# Patient Record
Sex: Female | Born: 1970 | Race: White | Hispanic: No | Marital: Married | Smoking: Never smoker
Health system: Southern US, Community
[De-identification: ages and names within clinical notes are randomized; demographics above are authoritative.]

## PROBLEM LIST (undated history)

## (undated) HISTORY — PX: TUBAL LIGATION: SHX77

## (undated) HISTORY — PX: OTHER SURGICAL HISTORY: SHX169

---

## 2015-07-09 ENCOUNTER — Ambulatory Visit (INDEPENDENT_AMBULATORY_CARE_PROVIDER_SITE_OTHER): Payer: BC Managed Care – PPO | Admitting: Family Medicine

## 2015-07-09 ENCOUNTER — Encounter: Payer: Self-pay | Admitting: Family Medicine

## 2015-07-09 VITALS — BP 134/81 | HR 57 | Temp 98.0°F | Ht 65.0 in | Wt 223.0 lb

## 2015-07-09 DIAGNOSIS — M25562 Pain in left knee: Secondary | ICD-10-CM | POA: Diagnosis not present

## 2015-07-09 MED ORDER — MELOXICAM 15 MG PO TABS: 15.0000 mg | ORAL_TABLET | Freq: Every day | ORAL | Status: DC

## 2015-07-09 NOTE — Progress Notes (Signed)
   BP 134/81 mmHg  Pulse 57  Temp(Src) 98 F (36.7 C)  Ht  (1.651 m)  Wt 223 lb (101.152 kg)  BMI 37.11 kg/m2  SpO2 99%  LMP 07/08/2015   Subjective:    Patient ID: Julia Adams, female    DOB: Dec 31, 1970, 45 y.o.   MRN: [id number]  HPI: Julia Adams is a 45 y.o. female  Chief Complaint  Patient presents with  . Knee Pain    fell in December/ left side, starting to effect hip   she ran into an obstruction locking her knee in place and falling forward with resulting left knee pain and swelling which lasted for a month or so now knee continues to be bothersome will occasionally lock and hurt with any twisting motion and feels unstable Patient limps most days Has big trip scheduled in going to [place].  Relevant past medical, surgical, family and social history reviewed and updated as indicated. Interim medical history since our last visit reviewed. Allergies and medications reviewed and updated.  Review of Systems  Constitutional: Negative.   Respiratory: Negative.   Cardiovascular: Negative.     Per HPI unless specifically indicated above     Objective:    BP 134/81 mmHg  Pulse 57  Temp(Src) 98 F (36.7 C)  Ht  (1.651 m)  Wt 223 lb (101.152 kg)  BMI 37.11 kg/m2  SpO2 99%  LMP 07/08/2015  Wt Readings from Last 3 Encounters:  07/09/15 223 lb (101.152 kg)  06/26/14 229 lb (103.874 kg)    Physical Exam  Constitutional: She is oriented to person, place, and time. She appears well-developed and well-nourished. No distress.  HENT:  Head: Normocephalic and atraumatic.  Right Ear: Hearing normal.  Left Ear: Hearing normal.  Nose: Nose normal.  Eyes: Conjunctivae and lids are normal. Right eye exhibits no discharge. Left eye exhibits no discharge. No scleral icterus.  Pulmonary/Chest: Effort normal. No respiratory distress.  Musculoskeletal: Normal range of motion.  Left knee with no obvious effusion Anterior drawer sign positive with marked pain and discomfort  with joint laxity  Neurological: She is alert and oriented to person, place, and time.  Skin: Skin is intact. No rash noted.  Psychiatric: She has a normal mood and affect. Her speech is normal and behavior is normal. Judgment and thought content normal. Cognition and memory are normal.    No results found for this or any previous visit.    Assessment & Plan:   Problem List Items Addressed This Visit      Other   Knee pain, left - Primary    Knee pain ongoing since December with history and exam compatible with possible anterior cruciate ligament tear will refer to orthopedics to follow-up      Relevant Medications   meloxicam (MOBIC) 15 MG tablet   Other Relevant Orders   Ambulatory referral to Orthopedic Surgery       Follow up plan: Return for As scheduled.

## 2015-07-09 NOTE — Assessment & Plan Note (Signed)
Knee pain ongoing since December with history and exam compatible with possible anterior cruciate ligament tear will refer to orthopedics to follow-up

## 2015-07-30 ENCOUNTER — Ambulatory Visit (INDEPENDENT_AMBULATORY_CARE_PROVIDER_SITE_OTHER): Payer: BC Managed Care – PPO | Admitting: Family Medicine

## 2015-07-30 ENCOUNTER — Encounter: Payer: Self-pay | Admitting: Family Medicine

## 2015-07-30 VITALS — BP 116/77 | HR 65 | Temp 97.8°F | Ht 64.5 in | Wt 219.0 lb

## 2015-07-30 DIAGNOSIS — Z1239 Encounter for other screening for malignant neoplasm of breast: Secondary | ICD-10-CM | POA: Diagnosis not present

## 2015-07-30 DIAGNOSIS — Z0000 Encounter for general adult medical examination without abnormal findings: Secondary | ICD-10-CM

## 2015-07-30 DIAGNOSIS — N926 Irregular menstruation, unspecified: Secondary | ICD-10-CM | POA: Insufficient documentation

## 2015-07-30 LAB — URINALYSIS, ROUTINE W REFLEX MICROSCOPIC
BILIRUBIN UA: NEGATIVE
Glucose, UA: NEGATIVE
KETONES UA: NEGATIVE
Leukocytes, UA: NEGATIVE
NITRITE UA: NEGATIVE
Protein, UA: NEGATIVE
Urobilinogen, Ur: 0.2 mg/dL (ref 0.2–1.0)
pH, UA: 5.5 (ref 5.0–7.5)

## 2015-07-30 LAB — MICROSCOPIC EXAMINATION: WBC, UA: NONE SEEN /hpf (ref 0–?)

## 2015-07-30 NOTE — Progress Notes (Signed)
BP 116/77 mmHg  Pulse 65  Temp(Src) 97.8 F (36.6 C)  Ht 5' 4.5" (1.638 m)  Wt 219 lb (99.338 kg)  BMI 37.02 kg/m2  SpO2 99%  LMP 07/08/2015   Subjective:    Patient ID: Julia Adams, female    DOB: 04-Nov-1970, 45 y.o.   MRN: [id number]  HPI: Julia Adams is a 45 y.o. female  Chief Complaint  Patient presents with  . Annual Exam   shunt with nodule on left arch been present for about a year or so not painful but may be growing especially over the last month or 2 and become more prominent and sometimes rubs on her shoes. Has appointment coming up later this month to check for possible anterior cruciate ligament Knee pain meloxicam doing okay  Relevant past medical, surgical, family and social history reviewed and updated as indicated. Interim medical history since our last visit reviewed. Allergies and medications reviewed and updated.  Review of Systems  Constitutional: Negative.   HENT: Negative.   Eyes: Negative.   Respiratory: Negative.   Cardiovascular: Negative.   Gastrointestinal: Negative.   Endocrine: Negative.   Genitourinary: Negative.   Musculoskeletal: Negative.   Skin: Negative.   Allergic/Immunologic: Negative.   Neurological: Negative.   Hematological: Negative.   Psychiatric/Behavioral: Negative.     Per HPI unless specifically indicated above     Objective:    BP 116/77 mmHg  Pulse 65  Temp(Src) 97.8 F (36.6 C)  Ht 5' 4.5" (1.638 m)  Wt 219 lb (99.338 kg)  BMI 37.02 kg/m2  SpO2 99%  LMP 07/08/2015  Wt Readings from Last 3 Encounters:  07/30/15 219 lb (99.338 kg)  07/09/15 223 lb (101.152 kg)  06/26/14 229 lb (103.874 kg)    Physical Exam  Constitutional: She is oriented to person, place, and time. She appears well-developed and well-nourished.  HENT:  Head: Normocephalic and atraumatic.  Right Ear: External ear normal.  Left Ear: External ear normal.  Nose: Nose normal.  Mouth/Throat: Oropharynx is clear and moist.  Eyes:  Conjunctivae and EOM are normal. Pupils are equal, round, and reactive to light.  Neck: Normal range of motion. Neck supple. Carotid bruit is not present.  Cardiovascular: Normal rate, regular rhythm and normal heart sounds.   No murmur heard. Pulmonary/Chest: Effort normal and breath sounds normal. She exhibits no mass. Right breast exhibits no mass, no skin change and no tenderness. Left breast exhibits no mass, no skin change and no tenderness. Breasts are symmetrical.  Abdominal: Soft. Bowel sounds are normal. There is no hepatosplenomegaly.  Genitourinary: Vagina normal and uterus normal.  Musculoskeletal: Normal range of motion.  Neurological: She is alert and oriented to person, place, and time.  Skin: No rash noted.  Psychiatric: She has a normal mood and affect. Her behavior is normal. Judgment and thought content normal.    No results found for this or any previous visit.    Assessment & Plan:   Problem List Items Addressed This Visit      Other   Irregular periods    Discuss irregular periods and perimenopausal will observe for now       Other Visit Diagnoses    Breast cancer screening    -  Primary    Relevant Orders    MM Digital Screening    PE (physical exam), annual        Relevant Orders    Comprehensive metabolic panel    Lipid panel    CBC with  Differential/Platelet    TSH    Urinalysis, Routine w reflex microscopic (not at [facility])        Follow up plan: Return in about 1 year (around 07/29/2016) for Physical Exam.

## 2015-07-30 NOTE — Addendum Note (Signed)
Addended by: Lurlean HornsWILSON, NANCY H on: 07/30/2015 03:41 PM   Modules accepted: Kipp BroodSmartSet

## 2015-07-30 NOTE — Assessment & Plan Note (Signed)
Discuss irregular periods and perimenopausal will observe for now

## 2015-07-31 ENCOUNTER — Encounter: Payer: Self-pay | Admitting: Family Medicine

## 2015-07-31 LAB — CBC WITH DIFFERENTIAL/PLATELET
BASOS: 0 %
Basophils Absolute: 0 10*3/uL (ref 0.0–0.2)
EOS (ABSOLUTE): 0 10*3/uL (ref 0.0–0.4)
Eos: 0 %
HEMOGLOBIN: 13.5 g/dL (ref 11.1–15.9)
Hematocrit: 41.7 % (ref 34.0–46.6)
IMMATURE GRANS (ABS): 0 10*3/uL (ref 0.0–0.1)
Immature Granulocytes: 0 %
LYMPHS ABS: 2.9 10*3/uL (ref 0.7–3.1)
LYMPHS: 32 %
MCH: 28.9 pg (ref 26.6–33.0)
MCHC: 32.4 g/dL (ref 31.5–35.7)
MCV: 89 fL (ref 79–97)
Monocytes Absolute: 0.6 10*3/uL (ref 0.1–0.9)
Monocytes: 6 %
NEUTROS ABS: 5.7 10*3/uL (ref 1.4–7.0)
NEUTROS PCT: 62 %
PLATELETS: 288 10*3/uL (ref 150–379)
RBC: 4.67 x10E6/uL (ref 3.77–5.28)
RDW: 14.1 % (ref 12.3–15.4)
WBC: 9.2 10*3/uL (ref 3.4–10.8)

## 2015-07-31 LAB — LIPID PANEL
CHOL/HDL RATIO: 3.8 ratio (ref 0.0–4.4)
Cholesterol, Total: 144 mg/dL (ref 100–199)
HDL: 38 mg/dL — ABNORMAL LOW (ref >39)
LDL CALC: 82 mg/dL (ref 0–99)
TRIGLYCERIDES: 121 mg/dL (ref 0–149)
VLDL CHOLESTEROL CAL: 24 mg/dL (ref 5–40)

## 2015-07-31 LAB — COMPREHENSIVE METABOLIC PANEL
ALBUMIN: 4.4 g/dL (ref 3.5–5.5)
ALT: 13 IU/L (ref 0–32)
AST: 18 IU/L (ref 0–40)
Albumin/Globulin Ratio: 1.5 (ref 1.2–2.2)
Alkaline Phosphatase: 73 IU/L (ref 39–117)
BUN / CREAT RATIO: 14 (ref 9–23)
BUN: 14 mg/dL (ref 6–24)
Bilirubin Total: 0.2 mg/dL (ref 0.0–1.2)
CALCIUM: 9.5 mg/dL (ref 8.7–10.2)
CO2: 22 mmol/L (ref 18–29)
CREATININE: 1.01 mg/dL — AB (ref 0.57–1.00)
Chloride: 100 mmol/L (ref 96–106)
GFR calc Af Amer: 77 mL/min/{1.73_m2} (ref >59)
GFR, EST NON AFRICAN AMERICAN: 67 mL/min/{1.73_m2} (ref >59)
GLOBULIN, TOTAL: 2.9 g/dL (ref 1.5–4.5)
Glucose: 86 mg/dL (ref 65–99)
Potassium: 4.1 mmol/L (ref 3.5–5.2)
SODIUM: 139 mmol/L (ref 134–144)
TOTAL PROTEIN: 7.3 g/dL (ref 6.0–8.5)

## 2015-07-31 LAB — TSH: TSH: 0.961 u[IU]/mL (ref 0.450–4.500)

## 2016-02-13 ENCOUNTER — Encounter: Payer: Self-pay | Admitting: Family Medicine

## 2016-07-30 ENCOUNTER — Encounter: Payer: BC Managed Care – PPO | Admitting: Family Medicine

## 2016-08-19 ENCOUNTER — Encounter: Payer: BC Managed Care – PPO | Admitting: Family Medicine

## 2016-09-24 ENCOUNTER — Encounter: Payer: Self-pay | Admitting: Family Medicine

## 2017-11-09 ENCOUNTER — Encounter: Payer: Self-pay | Admitting: Family Medicine

## 2017-11-09 NOTE — Telephone Encounter (Signed)
Can you please get this patient scheduled with [facility]

## 2017-12-22 ENCOUNTER — Encounter: Payer: BC Managed Care – PPO | Admitting: Family Medicine

## 2018-01-02 ENCOUNTER — Emergency Department: Payer: 59

## 2018-01-02 ENCOUNTER — Emergency Department
Admission: EM | Admit: 2018-01-02 | Discharge: 2018-01-02 | Disposition: A | Discharge status: Home / Self Care | Payer: 59 | Attending: Emergency Medicine | Admitting: Emergency Medicine

## 2018-01-02 DIAGNOSIS — Y998 Other external cause status: Secondary | ICD-10-CM | POA: Insufficient documentation

## 2018-01-02 DIAGNOSIS — W010XXA Fall on same level from slipping, tripping and stumbling without subsequent striking against object, initial encounter: Secondary | ICD-10-CM | POA: Insufficient documentation

## 2018-01-02 DIAGNOSIS — S93104A Unspecified dislocation of right toe(s), initial encounter: Secondary | ICD-10-CM | POA: Insufficient documentation

## 2018-01-02 DIAGNOSIS — Z79899 Other long term (current) drug therapy: Secondary | ICD-10-CM | POA: Diagnosis not present

## 2018-01-02 DIAGNOSIS — Y929 Unspecified place or not applicable: Secondary | ICD-10-CM | POA: Diagnosis not present

## 2018-01-02 DIAGNOSIS — Y9301 Activity, walking, marching and hiking: Secondary | ICD-10-CM | POA: Diagnosis not present

## 2018-01-02 DIAGNOSIS — S99921A Unspecified injury of right foot, initial encounter: Secondary | ICD-10-CM | POA: Diagnosis present

## 2018-01-02 MED ORDER — LIDOCAINE HCL 1 % IJ SOLN: 5.0000 mL | Freq: Once | INTRAMUSCULAR | Status: DC

## 2018-01-02 MED ORDER — LIDOCAINE HCL (PF) 1 % IJ SOLN
INTRAMUSCULAR | Status: AC
  Filled 2018-01-02: qty 5

## 2018-01-02 NOTE — ED Provider Notes (Signed)
[facility] Emergency Department Provider Note  ____________________________________________  Time seen: Approximately 9:28 PM  I have reviewed the triage vital signs and the nursing notes.   HISTORY  Chief Complaint Toe Pain    HPI Julia Adams is a 47 y.o. female presents to the emergency department with acute 4/10 aching right fifth toe pain.  Patient reports that she was walking outside when she slipped in mud which caused hyper abduction at the right fifth toe. No weakness or radiculopathy. No alleviating measures have been attempted.    No past medical history on file.  Patient Active Problem List   Diagnosis Date Noted  . Irregular periods 07/30/2015  . Knee pain, left 07/09/2015    Past Surgical History:  Procedure Laterality Date  . ganglion cyst removal Right    index finger  . TUBAL LIGATION      Prior to Admission medications   Medication Sig Start Date End Date Taking? Authorizing Provider  meloxicam (MOBIC) 15 MG tablet Take 1 tablet (15 mg total) by mouth daily. 07/09/15   Steele Sizerrissman, Mark A, MD    Allergies Doxycycline  Family History  Problem Relation Age of Onset  . Arthritis Mother   . Thyroid disease Mother   . Heart disease Father   . Diabetes Father   . Hypertension Father   . Hyperlipidemia Father   . Stroke Father   . Arthritis Sister   . Diabetes Sister   . Cancer Paternal Grandfather        colon    Social History Social History   Tobacco Use  . Smoking status: Never Smoker  . Smokeless tobacco: Never Used  Substance Use Topics  . Alcohol use: No  . Drug use: No     Review of Systems  Constitutional: No fever/chills Eyes: No visual changes. No discharge ENT: No upper respiratory complaints. Cardiovascular: no chest pain. Respiratory: no cough. No SOB. Gastrointestinal: No abdominal pain.  No nausea, no vomiting.  No diarrhea.  No constipation. Musculoskeletal: Patient has right fifth toe pain.   Skin: Negative for rash, abrasions, lacerations, ecchymosis. Neurological: Negative for headaches, focal weakness or numbness.   ____________________________________________   PHYSICAL EXAM:  VITAL SIGNS: ED Triage Vitals  Enc Vitals Group     BP 01/02/18 2005 (!) 143/76     Pulse Rate 01/02/18 2005 63     Resp 01/02/18 2005 18     Temp 01/02/18 2005 98.5 F (36.9 C)     Temp Source 01/02/18 2005 Oral     SpO2 01/02/18 2005 99 %     Weight 01/02/18 1959 212 lb (96.2 kg)     Height 01/02/18 1959 5\' 6"  (1.676 m)     Head Circumference --      Peak Flow --      Pain Score 01/02/18 1959 0     Pain Loc --      Pain Edu? --      Excl. in GC? --      Constitutional: Alert and oriented. Well appearing and in no acute distress. Eyes: Conjunctivae are normal. PERRL. EOMI. Head: Atraumatic. Cardiovascular: Normal rate, regular rhythm. Normal S1 and S2.  Good peripheral circulation. Respiratory: Normal respiratory effort without tachypnea or retractions. Lungs CTAB. Good air entry to the bases with no decreased or absent breath sounds. Musculoskeletal: Right fifth toe is in abduction at baseline.  Patient is able to perform flexion and extension at the right ankle.  Palpable dorsalis pedis pulse,  right. Neurologic:  Normal speech and language. No gross focal neurologic deficits are appreciated.  Skin:  Skin is warm, dry and intact. No rash noted. Psychiatric: Mood and affect are normal. Speech and behavior are normal. Patient exhibits appropriate insight and judgement.   ____________________________________________   LABS (all labs ordered are listed, but only abnormal results are displayed)  Labs Reviewed - No data to display ____________________________________________  EKG   ____________________________________________  RADIOLOGY I personally viewed and evaluated these images as part of my medical decision making, as well as reviewing the written report by the  radiologist.  Dg Toe 5th Right  Result Date: 01/02/2018 CLINICAL DATA:  Deformity and pain of the right fifth digit. EXAM: RIGHT FIFTH TOE COMPARISON:  None. FINDINGS: There is severe dislocation at the right first interphalangeal joint with dorsal displacement of the distal phalanx. Tiny calcific density adjacent to the articular cortex of the proximal first right phalanx may represent a tiny avulsion fracture. IMPRESSION: Severe dorsal dislocation at the right first interphalangeal joint, with possible tiny avulsion fracture. Electronically Signed   By: Ted Mcalpineobrinka  Dimitrova M.D.   On: 01/02/2018 20:41    ____________________________________________    PROCEDURES  Procedure(s) performed:    Procedures    Medications  lidocaine (XYLOCAINE) 1 % (with pres) injection 5 mL (has no administration in time range)  lidocaine (PF) (XYLOCAINE) 1 % injection (has no administration in time range)     ____________________________________________   INITIAL IMPRESSION / ASSESSMENT AND PLAN / ED COURSE  Pertinent labs & imaging results that were available during my care of the patient were reviewed by me and considered in my medical decision making (see chart for details).  Review of the [place] CSRS was performed in accordance of the [facility] prior to dispensing any controlled drugs.      Assessment and plan Right fifth toe pain Patient presents to the emergency department with right fifth toe pain that occurred after she slipped in some mud.  X-ray examination is concerning for a right fifth toe dislocation.  Patient underwent reduction in the emergency department using traction.  Vital signs were reassuring prior to discharge.  All patient questions were answered.     ____________________________________________  FINAL CLINICAL IMPRESSION(S) / ED DIAGNOSES  Final diagnoses:  Closed dislocation of fifth toe of right foot, initial encounter      NEW MEDICATIONS STARTED DURING THIS  VISIT:  ED Discharge Orders    None          This chart was dictated using voice recognition software/Dragon. Despite best efforts to proofread, errors can occur which can change the meaning. Any change was purely unintentional.    Gasper LloydWoods, Gleason Ardoin M, PA-C 01/02/18 2132    Loleta RoseForbach, Cory, MD 01/02/18 2228

## 2018-01-02 NOTE — ED Triage Notes (Signed)
Reports fell and that her right pinky toe was bent backwards.

## 2018-03-10 ENCOUNTER — Encounter: Payer: BC Managed Care – PPO | Admitting: Family Medicine

## 2020-08-23 ENCOUNTER — Ambulatory Visit: Payer: Self-pay | Admitting: Internal Medicine

## 2020-09-06 ENCOUNTER — Ambulatory Visit
Admission: RE | Admit: 2020-09-06 | Discharge: 2020-09-06 | Disposition: A | Discharge status: Home / Self Care | Payer: 59 | Source: Ambulatory Visit | Attending: Internal Medicine | Admitting: Internal Medicine

## 2020-09-06 ENCOUNTER — Other Ambulatory Visit: Payer: Self-pay

## 2020-09-06 ENCOUNTER — Ambulatory Visit
Admission: RE | Admit: 2020-09-06 | Discharge: 2020-09-06 | Disposition: A | Discharge status: Home / Self Care | Payer: 59 | Attending: Internal Medicine | Admitting: Internal Medicine

## 2020-09-06 ENCOUNTER — Ambulatory Visit: Payer: 59 | Admitting: Internal Medicine

## 2020-09-06 ENCOUNTER — Encounter: Payer: Self-pay | Admitting: Internal Medicine

## 2020-09-06 VITALS — BP 133/85 | HR 65 | Temp 98.3°F | Ht 63.9 in | Wt 236.2 lb

## 2020-09-06 DIAGNOSIS — Z1211 Encounter for screening for malignant neoplasm of colon: Secondary | ICD-10-CM

## 2020-09-06 DIAGNOSIS — M25562 Pain in left knee: Secondary | ICD-10-CM

## 2020-09-06 DIAGNOSIS — Z1231 Encounter for screening mammogram for malignant neoplasm of breast: Secondary | ICD-10-CM

## 2020-09-06 DIAGNOSIS — Z124 Encounter for screening for malignant neoplasm of cervix: Secondary | ICD-10-CM

## 2020-09-06 MED ORDER — METHYLPREDNISOLONE 4 MG PO TBPK: ORAL_TABLET | ORAL | 0 refills | Status: DC

## 2020-09-06 NOTE — Progress Notes (Signed)
BP 133/85   Pulse 65   Temp 98.3 F (36.8 C) (Oral)   Ht 5' 3.9" (1.623 m)   Wt 236 lb 3.2 oz (107.1 kg)   LMP 08/06/2020 (Approximate)   SpO2 97%   BMI 40.67 kg/m    Subjective:    Patient ID: Tanya Nones, female    DOB: [birth year], 50 y.o.   MRN: [id number]  HPI: Joy Reiger is a 50 y.o. female  Seen last in may 2019 - saw Dr. Dossie Arbour here  Had covid in October lost taste and smell slowly coming back  Knee Pain  Incident onset: new pain now - hurts in the medial part of the left knee better since started taking glucosamine couldnt bend or straighten. Incident location: started in october getting worse, was her moms caregiver - she had cancer - and pased away  The pain is present in the left knee. The pain is at a severity of 8/10. The pain is moderate. Pertinent negatives include no inability to bear weight, loss of motion, loss of sensation, muscle weakness, numbness or tingling. The treatment provided moderate relief.    Chief Complaint  Patient presents with  . New Patient (Initial Visit)    To establish care.  Patient has a spot on her left upper arm that drains.  . Knee Pain    Relevant past medical, surgical, family and social history reviewed and updated as indicated. Interim medical history since our last visit reviewed. Allergies and medications reviewed and updated.  Review of Systems  Constitutional: Negative for activity change, appetite change, chills, fatigue, fever and unexpected weight change.  HENT: Negative for congestion, sinus pressure and sinus pain.   Eyes: Negative for pain, discharge and itching.  Respiratory: Negative for apnea, cough, choking and shortness of breath.   Gastrointestinal: Diarrhea: screen.  Endocrine: Negative for cold intolerance and heat intolerance.  Genitourinary: Negative for difficulty urinating and dyspareunia.  Musculoskeletal: Positive for joint swelling. Negative for arthralgias and gait problem.  Neurological:  Negative for dizziness, tingling, seizures, syncope, light-headedness, numbness and headaches.    Per HPI unless specifically indicated above     Objective:    BP 133/85   Pulse 65   Temp 98.3 F (36.8 C) (Oral)   Ht 5' 3.9" (1.623 m)   Wt 236 lb 3.2 oz (107.1 kg)   LMP 08/06/2020 (Approximate)   SpO2 97%   BMI 40.67 kg/m   Wt Readings from Last 3 Encounters:  09/06/20 236 lb 3.2 oz (107.1 kg)  01/02/18 212 lb (96.2 kg)  07/30/15 219 lb (99.3 kg)    Physical Exam Vitals and nursing note reviewed.  Constitutional:      General: She is not in acute distress.    Appearance: Normal appearance. She is not ill-appearing or diaphoretic.  HENT:     Head: Normocephalic and atraumatic.     Right Ear: Tympanic membrane and external ear normal. There is no impacted cerumen.     Left Ear: External ear normal.     Nose: No congestion or rhinorrhea.     Mouth/Throat:     Pharynx: No oropharyngeal exudate or posterior oropharyngeal erythema.  Eyes:     Conjunctiva/sclera: Conjunctivae normal.     Pupils: Pupils are equal, round, and reactive to light.  Cardiovascular:     Rate and Rhythm: Normal rate and regular rhythm.     Heart sounds: No murmur heard. No friction rub. No gallop.   Pulmonary:     Effort:  No respiratory distress.     Breath sounds: No stridor. No wheezing or rhonchi.  Chest:     Chest wall: No tenderness.  Abdominal:     General: Abdomen is flat. Bowel sounds are normal. There is no distension.     Palpations: Abdomen is soft. There is no mass.     Tenderness: There is no abdominal tenderness. There is no guarding.  Musculoskeletal:        General: Tenderness and deformity present. No swelling.     Cervical back: Normal range of motion and neck supple. No rigidity or tenderness.     Right lower leg: No edema.     Left lower leg: No edema.  Skin:    General: Skin is warm and dry.     Coloration: Skin is not jaundiced.     Findings: No erythema.   Neurological:     Mental Status: She is alert and oriented to person, place, and time. Mental status is at baseline.  Psychiatric:        Mood and Affect: Mood normal.        Behavior: Behavior normal.        Thought Content: Thought content normal.        Judgment: Judgment normal.     Results for orders placed or performed in visit on 07/30/15  Microscopic Examination   URINE  Result Value Ref Range   WBC, UA None seen 0 - 5 /hpf   RBC, UA 0-2 0 - 2 /hpf   Epithelial Cells (non renal) 0-10 0 - 10 /hpf   Bacteria, UA Few None seen/Few  Comprehensive metabolic panel  Result Value Ref Range   Glucose 86 65 - 99 mg/dL   BUN 14 6 - 24 mg/dL   Creatinine, Ser 1.61 (H) 0.57 - 1.00 mg/dL   GFR calc non Af Amer 67 >59 mL/min/1.73   GFR calc Af Amer 77 >59 mL/min/1.73   BUN/Creatinine Ratio 14 9 - 23   Sodium 139 134 - 144 mmol/L   Potassium 4.1 3.5 - 5.2 mmol/L   Chloride 100 96 - 106 mmol/L   CO2 22 18 - 29 mmol/L   Calcium 9.5 8.7 - 10.2 mg/dL   Total Protein 7.3 6.0 - 8.5 g/dL   Albumin 4.4 3.5 - 5.5 g/dL   Globulin, Total 2.9 1.5 - 4.5 g/dL   Albumin/Globulin Ratio 1.5 1.2 - 2.2   Bilirubin Total 0.2 0.0 - 1.2 mg/dL   Alkaline Phosphatase 73 39 - 117 IU/L   AST 18 0 - 40 IU/L   ALT 13 0 - 32 IU/L  Lipid panel  Result Value Ref Range   Cholesterol, Total 144 100 - 199 mg/dL   Triglycerides 096 0 - 149 mg/dL   HDL 38 (L) >04 mg/dL   VLDL Cholesterol Cal 24 5 - 40 mg/dL   LDL Calculated 82 0 - 99 mg/dL   Chol/HDL Ratio 3.8 0.0 - 4.4 ratio units  CBC with Differential/Platelet  Result Value Ref Range   WBC 9.2 3.4 - 10.8 x10E3/uL   RBC 4.67 3.77 - 5.28 x10E6/uL   Hemoglobin 13.5 11.1 - 15.9 g/dL   Hematocrit 54.0 98.1 - 46.6 %   MCV 89 79 - 97 fL   MCH 28.9 26.6 - 33.0 pg   MCHC 32.4 31.5 - 35.7 g/dL   RDW 19.1 47.8 - 29.5 %   Platelets 288 150 - 379 x10E3/uL   Neutrophils 62 %   Lymphs 32 %  Monocytes 6 %   Eos 0 %   Basos 0 %   Neutrophils Absolute 5.7 1.4  - 7.0 x10E3/uL   Lymphocytes Absolute 2.9 0.7 - 3.1 x10E3/uL   Monocytes Absolute 0.6 0.1 - 0.9 x10E3/uL   EOS (ABSOLUTE) 0.0 0.0 - 0.4 x10E3/uL   Basophils Absolute 0.0 0.0 - 0.2 x10E3/uL   Immature Granulocytes 0 %   Immature Grans (Abs) 0.0 0.0 - 0.1 x10E3/uL  TSH  Result Value Ref Range   TSH 0.961 0.450 - 4.500 uIU/mL  Urinalysis, Routine w reflex microscopic (not at [facility])  Result Value Ref Range   Specific Gravity, UA <1.005 (L) 1.005 - 1.030   pH, UA 5.5 5.0 - 7.5   Color, UA Yellow Yellow   Appearance Ur Clear Clear   Leukocytes, UA Negative Negative   Protein, UA Negative Negative/Trace   Glucose, UA Negative Negative   Ketones, UA Negative Negative   RBC, UA 2+ (A) Negative   Bilirubin, UA Negative Negative   Urobilinogen, Ur 0.2 0.2 - 1.0 mg/dL   Nitrite, UA Negative Negative   Microscopic Examination See below:        No current outpatient medications on file.    Assessment & Plan:   knee pain :? Sec to arthritis  Check left knee xrays  ? sec RA vs Gout CHeck RF/ ANA / uric acid. check xrays of swollen joints. pt takes pain meds for such. Fu with me for results.  HM ordered as below.  Orders Placed This Encounter  Procedures  . DG KNEE 3 VIEW LEFT    Order Specific Question:   Reason for Exam (SYMPTOM  OR DIAGNOSIS REQUIRED)    Answer:   knee pain    Order Specific Question:   Is patient pregnant?    Answer:   No    Order Specific Question:   Preferred imaging location?    Answer:   [facility]    Order Specific Question:   Radiology Contrast Protocol - do NOT remove file path    Answer:   [email or web address]  . MM 3D SCREEN BREAST BILATERAL    Standing Status:   Future    Standing Expiration Date:   11/06/2020    Order Specific Question:   Reason for Exam (SYMPTOM  OR DIAGNOSIS REQUIRED)    Answer:   screening    Order Specific Question:   Is the patient pregnant?    Answer:   No    Order Specific Question:    Preferred imaging location?    Answer:   [facility]  . Rheumatoid factor  . ANA  . ANA+ENA+DNA/DS+Antich+Centr  . Uric acid  . Comprehensive metabolic panel  . CBC with Differential/Platelet  . Lyme Ab/Western Blot Reflex  . Ambulatory referral to Obstetrics / Gynecology    Referral Priority:   Routine    Referral Type:   Consultation    Referral Reason:   Specialty Services Required    Requested Specialty:   Obstetrics and Gynecology    Number of Visits Requested:   1  . Ambulatory referral to Gastroenterology    Referral Priority:   Routine    Referral Type:   Consultation    Referral Reason:   Specialty Services Required    Number of Visits Requested:   1     Problem List Items Addressed This Visit   None      Follow up plan: No follow-ups on file.  Health Maintenance :  Mammogram :ordered /  never head one  Paps smear: 2016 DEXA::ordered / never head one Cscope ::ordered / never head one

## 2020-09-07 LAB — CBC WITH DIFFERENTIAL/PLATELET
Basos: 1 %
EOS (ABSOLUTE): 0 10*3/uL (ref 0.0–0.4)
Eos: 1 %
Hemoglobin: 14.3 g/dL (ref 11.1–15.9)
Neutrophils: 61 %
WBC: 8.2 10*3/uL (ref 3.4–10.8)

## 2020-09-07 LAB — COMPREHENSIVE METABOLIC PANEL
AST: 24 IU/L (ref 0–40)
Alkaline Phosphatase: 114 IU/L (ref 44–121)
Bilirubin Total: 0.2 mg/dL (ref 0.0–1.2)
Potassium: 4.6 mmol/L (ref 3.5–5.2)

## 2020-09-08 LAB — COMPREHENSIVE METABOLIC PANEL
ALT: 25 IU/L (ref 0–32)
Albumin/Globulin Ratio: 1.4 (ref 1.2–2.2)
Albumin: 4.4 g/dL (ref 3.8–4.9)
BUN/Creatinine Ratio: 13 (ref 9–23)
BUN: 13 mg/dL (ref 6–24)
CO2: 19 mmol/L — ABNORMAL LOW (ref 20–29)
Calcium: 9.9 mg/dL (ref 8.7–10.2)
Chloride: 101 mmol/L (ref 96–106)
Creatinine, Ser: 1.03 mg/dL — ABNORMAL HIGH (ref 0.57–1.00)
Globulin, Total: 3.2 g/dL (ref 1.5–4.5)
Glucose: 90 mg/dL (ref 65–99)
Sodium: 140 mmol/L (ref 134–144)
Total Protein: 7.6 g/dL (ref 6.0–8.5)
eGFR: 66 mL/min/{1.73_m2} (ref >59)

## 2020-09-08 LAB — CBC WITH DIFFERENTIAL/PLATELET
Basophils Absolute: 0 10*3/uL (ref 0.0–0.2)
Hematocrit: 46.8 % — ABNORMAL HIGH (ref 34.0–46.6)
Immature Grans (Abs): 0 10*3/uL (ref 0.0–0.1)
Immature Granulocytes: 0 %
Lymphocytes Absolute: 2.6 10*3/uL (ref 0.7–3.1)
Lymphs: 31 %
MCH: 24.7 pg — ABNORMAL LOW (ref 26.6–33.0)
MCHC: 30.6 g/dL — ABNORMAL LOW (ref 31.5–35.7)
MCV: 81 fL (ref 79–97)
Monocytes Absolute: 0.5 10*3/uL (ref 0.1–0.9)
Monocytes: 6 %
Neutrophils Absolute: 5.1 10*3/uL (ref 1.4–7.0)
Platelets: 358 10*3/uL (ref 150–450)
RBC: 5.78 x10E6/uL — ABNORMAL HIGH (ref 3.77–5.28)
RDW: 18.8 % — ABNORMAL HIGH (ref 11.7–15.4)

## 2020-09-08 LAB — ANA+ENA+DNA/DS+ANTICH+CENTR
ANA Titer 1: NEGATIVE
Anti JO-1: <0.2 AI (ref 0.0–0.9)
Centromere Ab Screen: <0.2 AI (ref 0.0–0.9)
Chromatin Ab SerPl-aCnc: <0.2 AI (ref 0.0–0.9)
ENA RNP Ab: 1.7 AI — ABNORMAL HIGH (ref 0.0–0.9)
ENA SM Ab Ser-aCnc: <0.2 AI (ref 0.0–0.9)
ENA SSA (RO) Ab: <0.2 AI (ref 0.0–0.9)
ENA SSB (LA) Ab: <0.2 AI (ref 0.0–0.9)
Scleroderma (Scl-70) (ENA) Antibody, IgG: <0.2 AI (ref 0.0–0.9)
dsDNA Ab: 1 IU/mL (ref 0–9)

## 2020-09-08 LAB — RHEUMATOID FACTOR: Rhuematoid fact SerPl-aCnc: <10 IU/mL (ref <14.0)

## 2020-09-08 LAB — ANA: Anti Nuclear Antibody (ANA): POSITIVE — AB

## 2020-09-08 LAB — URIC ACID: Uric Acid: 5.4 mg/dL (ref 3.0–7.2)

## 2020-09-08 LAB — LYME AB/WESTERN BLOT REFLEX
LYME DISEASE AB, QUANT, IGM: <0.8 index (ref 0.00–0.79)
Lyme IgG/IgM Ab: <0.91 {ISR} (ref 0.00–0.90)

## 2020-09-10 ENCOUNTER — Ambulatory Visit: Payer: Self-pay | Admitting: Internal Medicine

## 2020-09-11 ENCOUNTER — Encounter: Payer: Self-pay | Admitting: Internal Medicine

## 2020-09-11 ENCOUNTER — Other Ambulatory Visit: Payer: Self-pay

## 2020-09-11 ENCOUNTER — Ambulatory Visit (INDEPENDENT_AMBULATORY_CARE_PROVIDER_SITE_OTHER): Payer: 59 | Admitting: Internal Medicine

## 2020-09-11 VITALS — BP 133/83 | HR 56 | Temp 98.3°F | Ht 63.9 in | Wt 237.2 lb

## 2020-09-11 DIAGNOSIS — M25562 Pain in left knee: Secondary | ICD-10-CM

## 2020-09-11 DIAGNOSIS — G8929 Other chronic pain: Secondary | ICD-10-CM

## 2020-09-11 DIAGNOSIS — R768 Other specified abnormal immunological findings in serum: Secondary | ICD-10-CM

## 2020-09-11 MED ORDER — TRIAMCINOLONE ACETONIDE 40 MG/ML IJ SUSP
40.0000 mg | Freq: Once | INTRAMUSCULAR | Status: AC
  Administered 2020-09-11: 40 mg via INTRAMUSCULAR

## 2020-09-11 NOTE — Progress Notes (Signed)
BP 133/83   Pulse (!) 56   Temp 98.3 F (36.8 C) (Oral)   Ht 5' 3.9" (1.623 m)   Wt 237 lb 3.2 oz (107.6 kg)   LMP 09/09/2020   SpO2 97%   BMI 40.85 kg/m    Subjective:    Patient ID: Julia Adams, female    DOB: December 23, 1970, 50 y.o.   MRN: [id number]  HPI: Julia Adams is a 50 y.o. female  Pt is here for a knee injection, has a positive ANA and RNP   Arthritis Presents for follow-up visit. She complains of stiffness. She reports no pain, joint swelling or joint warmth. The symptoms have been worsening. Affected locations include the left knee. Her pain is at a severity of 7/10. Pertinent negatives include no pain at night.    Chief Complaint  Patient presents with  . Knee injection    Patient would like to have her left knee today  . Review Labwork    Relevant past medical, surgical, family and social history reviewed and updated as indicated. Interim medical history since our last visit reviewed. Allergies and medications reviewed and updated.  Review of Systems  Musculoskeletal: Positive for arthritis and stiffness. Negative for joint swelling.    Per HPI unless specifically indicated above     Objective:    BP 133/83   Pulse (!) 56   Temp 98.3 F (36.8 C) (Oral)   Ht 5' 3.9" (1.623 m)   Wt 237 lb 3.2 oz (107.6 kg)   LMP 09/09/2020   SpO2 97%   BMI 40.85 kg/m   Wt Readings from Last 3 Encounters:  09/11/20 237 lb 3.2 oz (107.6 kg)  09/06/20 236 lb 3.2 oz (107.1 kg)  01/02/18 212 lb (96.2 kg)    Physical Exam  Results for orders placed or performed in visit on 09/06/20  Rheumatoid factor  Result Value Ref Range   Rhuematoid fact SerPl-aCnc <10.0 <14.0 IU/mL  ANA  Result Value Ref Range   Anti Nuclear Antibody (ANA) Positive (A) Negative  ANA+ENA+DNA/DS+Antich+Centr  Result Value Ref Range   ANA Titer 1 Negative    dsDNA Ab 1 0 - 9 IU/mL   ENA RNP Ab 1.7 (H) 0.0 - 0.9 AI   ENA SM Ab Ser-aCnc <0.2 0.0 - 0.9 AI   Scleroderma (Scl-70) (ENA)  Antibody, IgG <0.2 0.0 - 0.9 AI   ENA SSA (RO) Ab <0.2 0.0 - 0.9 AI   ENA SSB (LA) Ab <0.2 0.0 - 0.9 AI   Chromatin Ab SerPl-aCnc <0.2 0.0 - 0.9 AI   Anti JO-1 <0.2 0.0 - 0.9 AI   Centromere Ab Screen <0.2 0.0 - 0.9 AI   See below: Comment   Uric acid  Result Value Ref Range   Uric Acid 5.4 3.0 - 7.2 mg/dL  Comprehensive metabolic panel  Result Value Ref Range   Glucose 90 65 - 99 mg/dL   BUN 13 6 - 24 mg/dL   Creatinine, Ser 1.03 (H) 0.57 - 1.00 mg/dL   eGFR 66 >59 mL/min/1.73   BUN/Creatinine Ratio 13 9 - 23   Sodium 140 134 - 144 mmol/L   Potassium 4.6 3.5 - 5.2 mmol/L   Chloride 101 96 - 106 mmol/L   CO2 19 (L) 20 - 29 mmol/L   Calcium 9.9 8.7 - 10.2 mg/dL   Total Protein 7.6 6.0 - 8.5 g/dL   Albumin 4.4 3.8 - 4.9 g/dL   Globulin, Total 3.2 1.5 - 4.5 g/dL  Albumin/Globulin Ratio 1.4 1.2 - 2.2   Bilirubin Total 0.2 0.0 - 1.2 mg/dL   Alkaline Phosphatase 114 44 - 121 IU/L   AST 24 0 - 40 IU/L   ALT 25 0 - 32 IU/L  CBC with Differential/Platelet  Result Value Ref Range   WBC 8.2 3.4 - 10.8 x10E3/uL   RBC 5.78 (H) 3.77 - 5.28 x10E6/uL   Hemoglobin 14.3 11.1 - 15.9 g/dL   Hematocrit 46.8 (H) 34.0 - 46.6 %   MCV 81 79 - 97 fL   MCH 24.7 (L) 26.6 - 33.0 pg   MCHC 30.6 (L) 31.5 - 35.7 g/dL   RDW 18.8 (H) 11.7 - 15.4 %   Platelets 358 150 - 450 x10E3/uL   Neutrophils 61 Not Estab. %   Lymphs 31 Not Estab. %   Monocytes 6 Not Estab. %   Eos 1 Not Estab. %   Basos 1 Not Estab. %   Neutrophils Absolute 5.1 1.4 - 7.0 x10E3/uL   Lymphocytes Absolute 2.6 0.7 - 3.1 x10E3/uL   Monocytes Absolute 0.5 0.1 - 0.9 x10E3/uL   EOS (ABSOLUTE) 0.0 0.0 - 0.4 x10E3/uL   Basophils Absolute 0.0 0.0 - 0.2 x10E3/uL   Immature Granulocytes 0 Not Estab. %   Immature Grans (Abs) 0.0 0.0 - 0.1 x10E3/uL  Lyme Ab/Western Blot Reflex  Result Value Ref Range   Lyme IgG/IgM Ab <0.91 0.00 - 0.90 ISR   LYME DISEASE AB, QUANT, IGM <0.80 0.00 - 0.79 index        Current Outpatient Medications:   .  methylPREDNISolone (MEDROL DOSEPAK) 4 MG TBPK tablet, Use as directed, Disp: 1 each, Rfl: 0    Assessment & Plan:   Knee injection   The risks, benefits and side effects of treatment were discussed with the patient. After consent was obtained, using sterile technique the left knee was prepped Pt's left knee joint prepped and cleaned with alcohol wipes.   patella was palpated and  pt injected with 4 ml of kenalog and 1 ml of lidocaine under aseptic conditions. no bleeding or post procedural pain noted. Pt felt better after procedure, tolerated procedure well.  The patient was advised to call the office if symptoms worsen or do not improve. The patient is asked to continue to rest the knee for a few more days before resuming regular activities .Call or return to clinic prn if such symptoms occur or the knee fails to improve as anticipated.    labs reviewed from last visit Will refer pt to rheumatology for such  Will need additional wu and imaging for the above  FINDINGS: No evidence of fracture, dislocation, or joint effusion. Moderate narrowing of medial joint space is noted. Soft tissues are unremarkable.  IMPRESSION: Moderate degenerative joint disease.  No acute abnormality is noted.    Problem List Items Addressed This Visit      Other   Knee pain, left - Primary   Relevant Orders   Ambulatory referral to Rheumatology    Other Visit Diagnoses    Positive ANA (antinuclear antibody)       Relevant Orders   Ambulatory referral to Rheumatology       Follow up plan: No follow-ups on file.

## 2020-09-13 ENCOUNTER — Other Ambulatory Visit: Payer: Self-pay

## 2020-09-13 DIAGNOSIS — Z01419 Encounter for gynecological examination (general) (routine) without abnormal findings: Secondary | ICD-10-CM

## 2020-09-17 ENCOUNTER — Telehealth: Payer: Self-pay

## 2020-09-17 NOTE — Telephone Encounter (Signed)
[facility] referring for Pap smear, as part of routine gynecological examination. Called and left voicemail for patient to call back to be scheduled./ mychart message sent

## 2020-09-19 ENCOUNTER — Ambulatory Visit: Payer: Self-pay | Admitting: Internal Medicine

## 2020-10-12 ENCOUNTER — Encounter: Payer: Self-pay | Admitting: Advanced Practice Midwife

## 2020-10-19 ENCOUNTER — Encounter: Payer: Self-pay | Admitting: Advanced Practice Midwife

## 2020-10-19 ENCOUNTER — Other Ambulatory Visit (HOSPITAL_COMMUNITY)
Admission: RE | Admit: 2020-10-19 | Discharge: 2020-10-19 | Disposition: A | Discharge status: Home / Self Care | Payer: 59 | Source: Ambulatory Visit | Attending: Advanced Practice Midwife | Admitting: Advanced Practice Midwife

## 2020-10-19 ENCOUNTER — Other Ambulatory Visit: Payer: Self-pay

## 2020-10-19 ENCOUNTER — Telehealth (INDEPENDENT_AMBULATORY_CARE_PROVIDER_SITE_OTHER): Payer: Self-pay | Admitting: Gastroenterology

## 2020-10-19 ENCOUNTER — Ambulatory Visit: Payer: 59 | Admitting: Advanced Practice Midwife

## 2020-10-19 VITALS — BP 118/70 | Ht 64.0 in | Wt 238.0 lb

## 2020-10-19 DIAGNOSIS — Z1211 Encounter for screening for malignant neoplasm of colon: Secondary | ICD-10-CM

## 2020-10-19 DIAGNOSIS — Z124 Encounter for screening for malignant neoplasm of cervix: Secondary | ICD-10-CM | POA: Insufficient documentation

## 2020-10-19 MED ORDER — NA SULFATE-K SULFATE-MG SULF 17.5-3.13-1.6 GM/177ML PO SOLN: 1.0000 | Freq: Once | ORAL | 0 refills | Status: DC

## 2020-10-19 NOTE — Progress Notes (Signed)
Gastroenterology Pre-Procedure Review  Request Date:  11/08/2020 Requesting Physician: Dr. Marius Ditch  PATIENT REVIEW QUESTIONS: The patient responded to the following health history questions as indicated:    1. Are you having any GI issues? no 2. Do you have a personal history of Polyps? no 3. Do you have a family history of Colon Cancer or Polyps? yes (Paternal Grandfather had colon cancer ) 4. Diabetes Mellitus? no 5. Joint replacements in the past 12 months?no 6. Major health problems in the past 3 months?no 7. Any artificial heart valves, MVP, or defibrillator?no    MEDICATIONS & ALLERGIES:    Patient reports the following regarding taking any anticoagulation/antiplatelet therapy:   Plavix, Coumadin, Eliquis, Xarelto, Lovenox, Pradaxa, Brilinta, or Effient? no Aspirin? no  Patient confirms/reports the following medications:  Current Outpatient Medications  Medication Sig Dispense Refill  . Na Sulfate-K Sulfate-Mg Sulf 17.5-3.13-1.6 GM/177ML SOLN Take 1 kit by mouth once for 1 dose. 354 mL 0  . methylPREDNISolone (MEDROL DOSEPAK) 4 MG TBPK tablet Use as directed 1 each 0   No current facility-administered medications for this visit.    Patient confirms/reports the following allergies:  Allergies  Allergen Reactions  . Doxycycline Hives    No orders of the defined types were placed in this encounter.   AUTHORIZATION INFORMATION Primary Insurance: 1D#: Group #:  Secondary Insurance: 1D#: Group #:  SCHEDULE INFORMATION: Date:  11/08/2020  Time: Location: [place]

## 2020-10-22 ENCOUNTER — Encounter: Payer: Self-pay | Admitting: Advanced Practice Midwife

## 2020-10-22 NOTE — Progress Notes (Signed)
Patient ID: Julia Adams, female   DOB: 1971-03-27, 50 y.o.   MRN: [id number]  Reason for Consult: Gynecologic Exam   Referred by Loura Pardon, MD  Subjective:  Date of Service: 10/19/2020  HPI:  Julia Adams is a 50 y.o. female being seen for PAP smear. She has no concerns today and has recently had annual exam with her PCP.  History reviewed. No pertinent past medical history. Family History  Problem Relation Age of Onset  . Arthritis Mother   . Thyroid disease Mother   . Heart disease Father   . Diabetes Father   . Hypertension Father   . Hyperlipidemia Father   . Stroke Father   . Arthritis Sister   . Diabetes Sister   . Cancer Paternal Grandfather        colon   Past Surgical History:  Procedure Laterality Date  . ganglion cyst removal Right    index finger  . TUBAL LIGATION      Short Social History:  Social History   Tobacco Use  . Smoking status: Never Smoker  . Smokeless tobacco: Never Used  Substance Use Topics  . Alcohol use: No    Allergies  Allergen Reactions  . Doxycycline Hives    Current Outpatient Medications  Medication Sig Dispense Refill  . diclofenac Sodium (VOLTAREN) 1 % GEL Apply 2 g topically 4 (four) times daily.    Marland Kitchen GLUCOSAMINE-CHONDROITIN-MSM PO Take by mouth.    . Multiple Vitamin (MULTI-VITAMIN) tablet Take 1 tablet by mouth daily.     No current facility-administered medications for this visit.   Review of Systems  Constitutional: Negative for chills and fever.  HENT: Negative for congestion, ear discharge, ear pain, hearing loss, sinus pain and sore throat.   Eyes: Negative for blurred vision and double vision.  Respiratory: Negative for cough, shortness of breath and wheezing.   Cardiovascular: Negative for chest pain, palpitations and leg swelling.  Gastrointestinal: Negative for abdominal pain, blood in stool, constipation, diarrhea, heartburn, melena, nausea and vomiting.  Genitourinary: Negative for dysuria, flank  pain, frequency, hematuria and urgency.  Musculoskeletal: Negative for back pain, joint pain and myalgias.  Skin: Negative for itching and rash.  Neurological: Negative for dizziness, tingling, tremors, sensory change, speech change, focal weakness, seizures, loss of consciousness, weakness and headaches.  Endo/Heme/Allergies: Negative for environmental allergies. Does not bruise/bleed easily.  Psychiatric/Behavioral: Negative for depression, hallucinations, memory loss, substance abuse and suicidal ideas. The patient is not nervous/anxious and does not have insomnia.         Objective:  Objective   Vitals:   10/19/20 1538  BP: 118/70  Weight: 238 lb (108 kg)  Height: 5\' 4"  (1.626 m)   Body mass index is 40.85 kg/m. Constitutional: Well nourished, well developed female in no acute distress.  HEENT: normal Skin: Warm and dry.  Cardiovascular: Regular rate and rhythm.   Respiratory: Clear to auscultation bilateral. Normal respiratory effort Neuro: DTRs 2+, Cranial nerves grossly intact Psych: Alert and Oriented x3. No memory deficits. Normal mood and affect.  MS: normal gait, normal bilateral lower extremity ROM/strength/stability.  Pelvic exam:  is not limited by body habitus EGBUS: within normal limits Vagina: within normal limits and with normal mucosa  Cervix: 1.25 cm polyp seen at external os and otherwise normal appearance   Assessment/Plan:     50 y.o. G3 P3003 being seen for PAP smear only by referral from PCP  PAP/HPV today Follow up as needed and PRN   50  Ersa Delaney CNM [facility] 10/22/2020, 3:19 PM

## 2020-10-23 LAB — CYTOLOGY - PAP
Comment: NEGATIVE
Diagnosis: NEGATIVE
High risk HPV: NEGATIVE

## 2020-10-26 ENCOUNTER — Other Ambulatory Visit: Payer: Self-pay | Admitting: Internal Medicine

## 2020-10-26 DIAGNOSIS — Z1231 Encounter for screening mammogram for malignant neoplasm of breast: Secondary | ICD-10-CM

## 2020-11-07 ENCOUNTER — Telehealth: Payer: Self-pay

## 2020-11-07 DIAGNOSIS — Z1211 Encounter for screening for malignant neoplasm of colon: Secondary | ICD-10-CM

## 2020-11-07 NOTE — Telephone Encounter (Signed)
Ok please send in referral to requested office thnx much

## 2020-11-07 NOTE — Telephone Encounter (Signed)
Please see if pt needs mammo at the said faciltiy if not give her other options please thnx.

## 2020-11-07 NOTE — Telephone Encounter (Signed)
Copied from CRM [id number]. Topic: General - Other >> Nov 06, 2020  2:25 PM Jaquita Rector A wrote: Reason for CRM: Patient called in to inquire of Dr Charlotta Newton to get a  referral through [facility] for a mammogram at [facility] center Ph# [phone]

## 2020-11-07 NOTE — Telephone Encounter (Signed)
Called and spoke with patient, she would like a referral for a colonoscopy not a mammogram through the listed office.

## 2020-11-07 NOTE — Telephone Encounter (Signed)
Referral placed.

## 2020-11-07 NOTE — Telephone Encounter (Signed)
Ok please refer pt for colonoscopy to the location she requested thnx.

## 2020-11-08 ENCOUNTER — Encounter: Admission: RE | Payer: Self-pay | Source: Home / Self Care

## 2020-11-08 ENCOUNTER — Ambulatory Visit: Admission: RE | Admit: 2020-11-08 | Payer: 59 | Source: Home / Self Care | Admitting: Gastroenterology

## 2020-11-08 SURGERY — COLONOSCOPY WITH PROPOFOL
Anesthesia: General

## 2020-11-15 ENCOUNTER — Other Ambulatory Visit: Payer: Self-pay

## 2020-11-15 ENCOUNTER — Ambulatory Visit
Admission: RE | Admit: 2020-11-15 | Discharge: 2020-11-15 | Disposition: A | Discharge status: Home / Self Care | Payer: 59 | Source: Ambulatory Visit

## 2020-11-15 DIAGNOSIS — Z1231 Encounter for screening mammogram for malignant neoplasm of breast: Secondary | ICD-10-CM

## 2020-11-21 ENCOUNTER — Other Ambulatory Visit: Payer: Self-pay | Admitting: Internal Medicine

## 2020-11-21 DIAGNOSIS — R928 Other abnormal and inconclusive findings on diagnostic imaging of breast: Secondary | ICD-10-CM

## 2020-12-04 ENCOUNTER — Ambulatory Visit
Admission: RE | Admit: 2020-12-04 | Discharge: 2020-12-04 | Disposition: A | Discharge status: Home / Self Care | Payer: 59 | Source: Ambulatory Visit | Attending: Internal Medicine | Admitting: Internal Medicine

## 2020-12-04 ENCOUNTER — Other Ambulatory Visit: Payer: Self-pay

## 2020-12-04 DIAGNOSIS — R928 Other abnormal and inconclusive findings on diagnostic imaging of breast: Secondary | ICD-10-CM

## 2020-12-04 NOTE — Progress Notes (Signed)
Please let pt know this was normal.

## 2021-07-19 IMAGING — DX DG KNEE COMPLETE 4+V*L*
4 series · 4 of 4 positions shown · non-contrast
Comparison: None.

CLINICAL DATA: Chronic left knee pain and swelling without known
injury.

EXAM:
LEFT KNEE - COMPLETE 4+ VIEW

[knee ap]
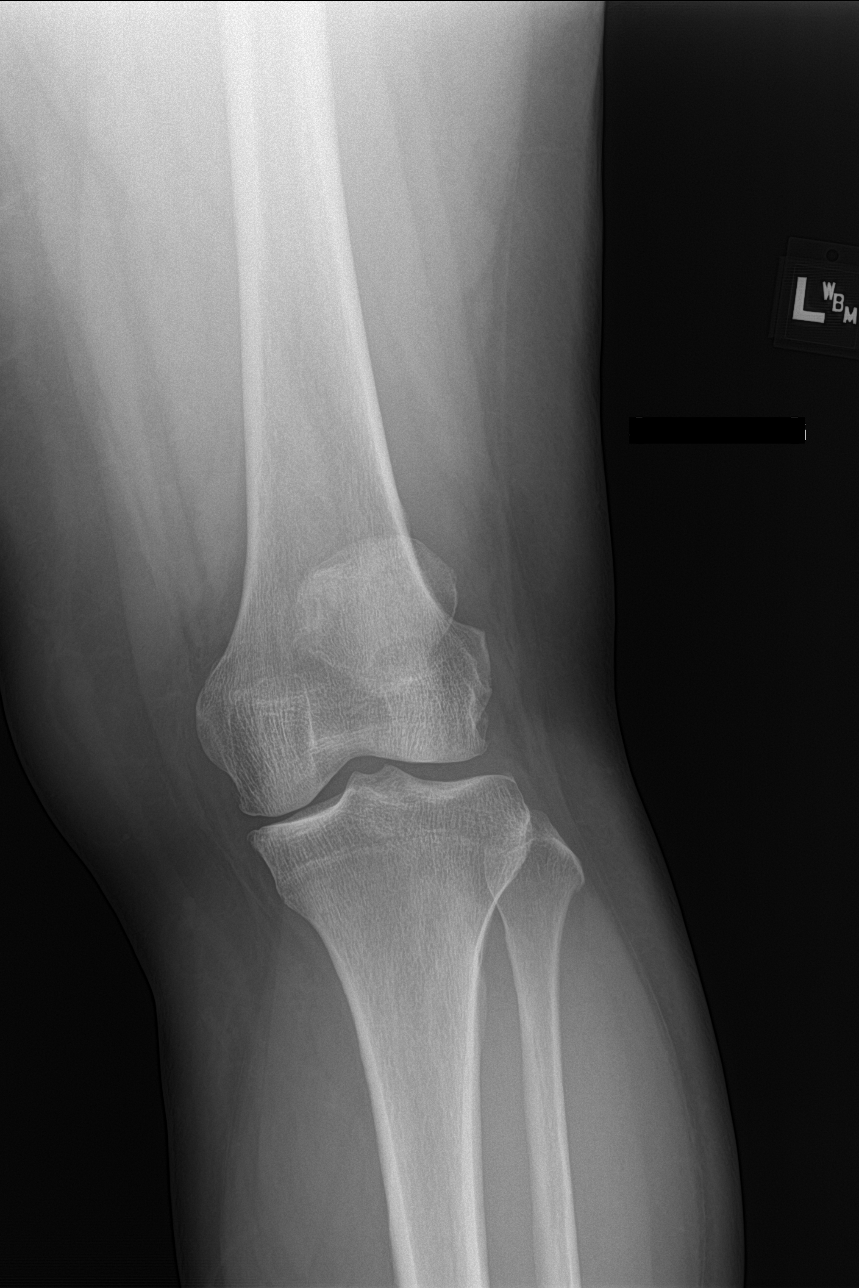

[knee tunnel]
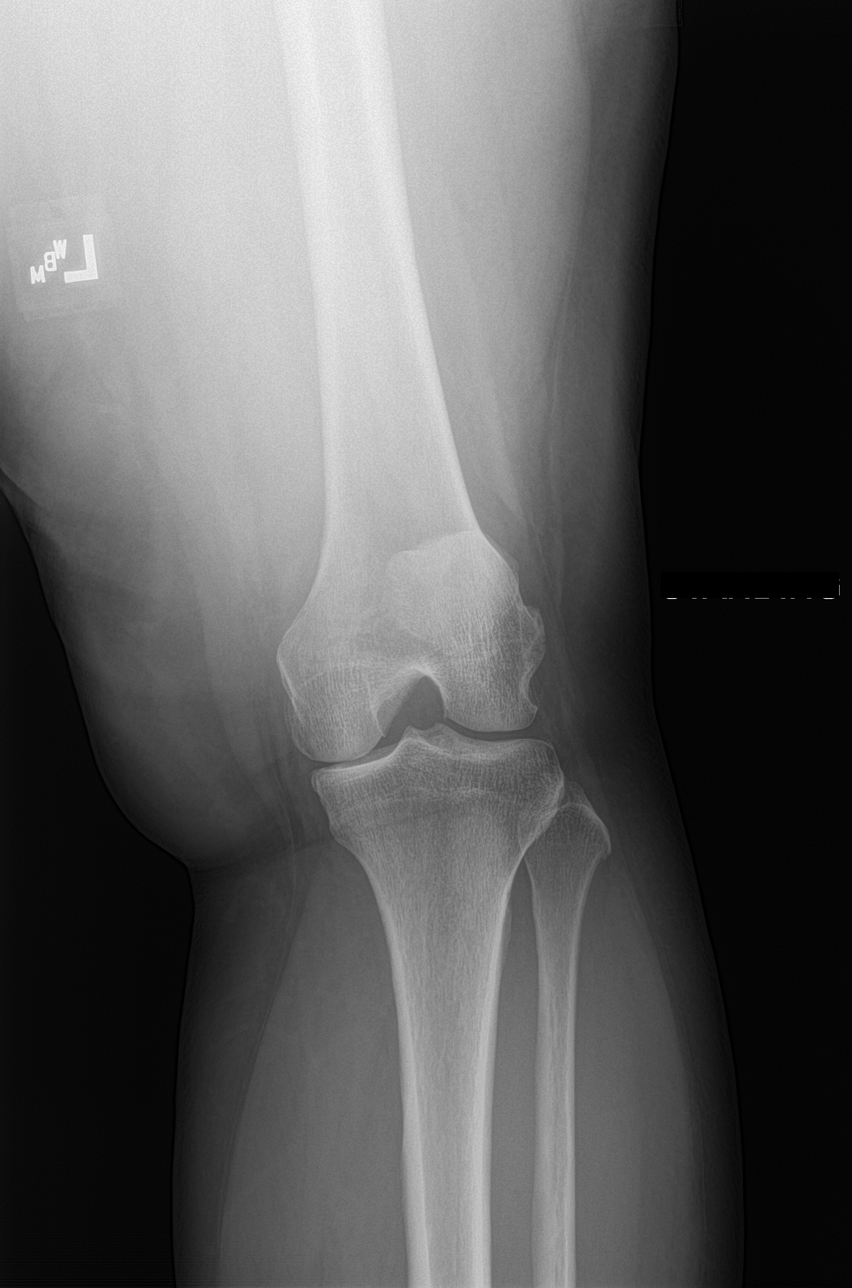

[knee lat]
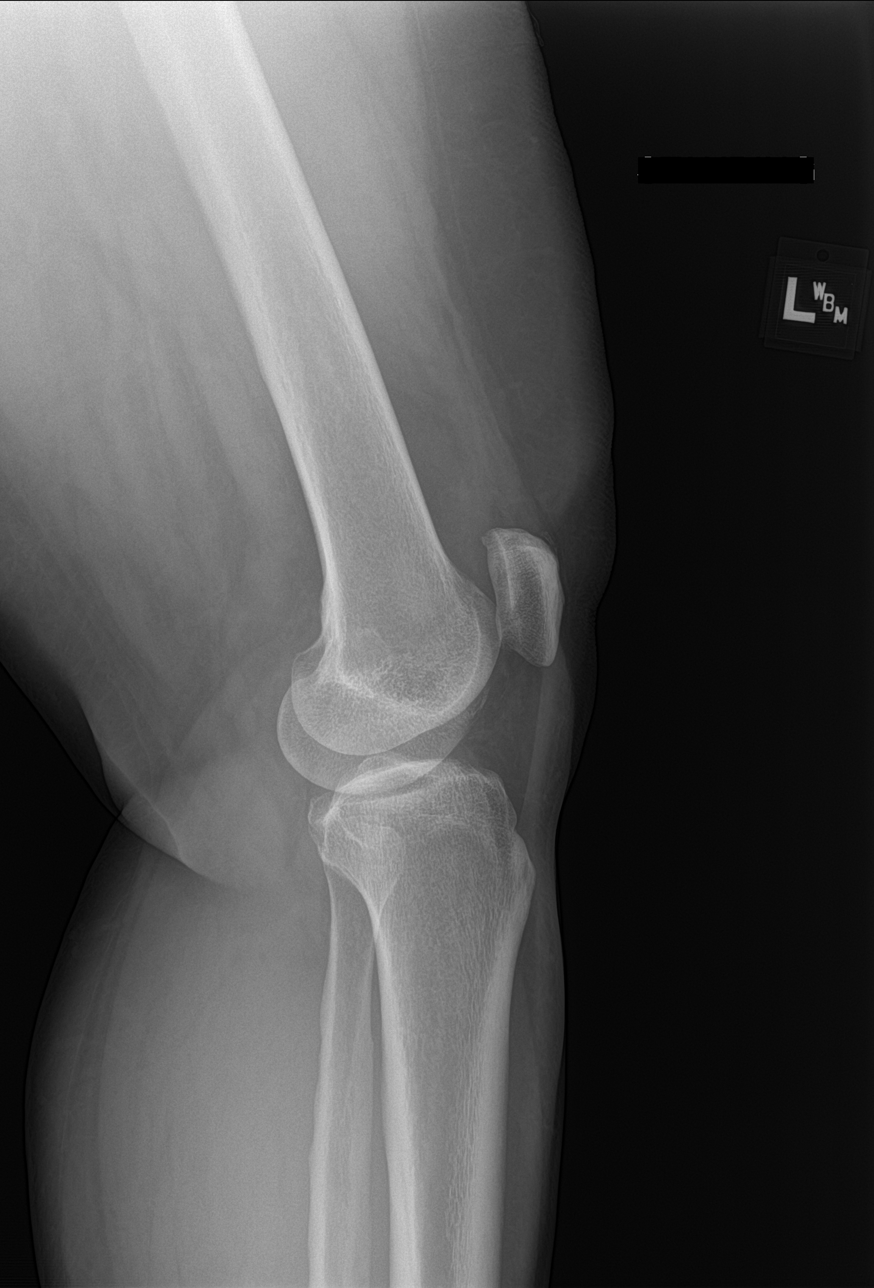

[patella skyline]
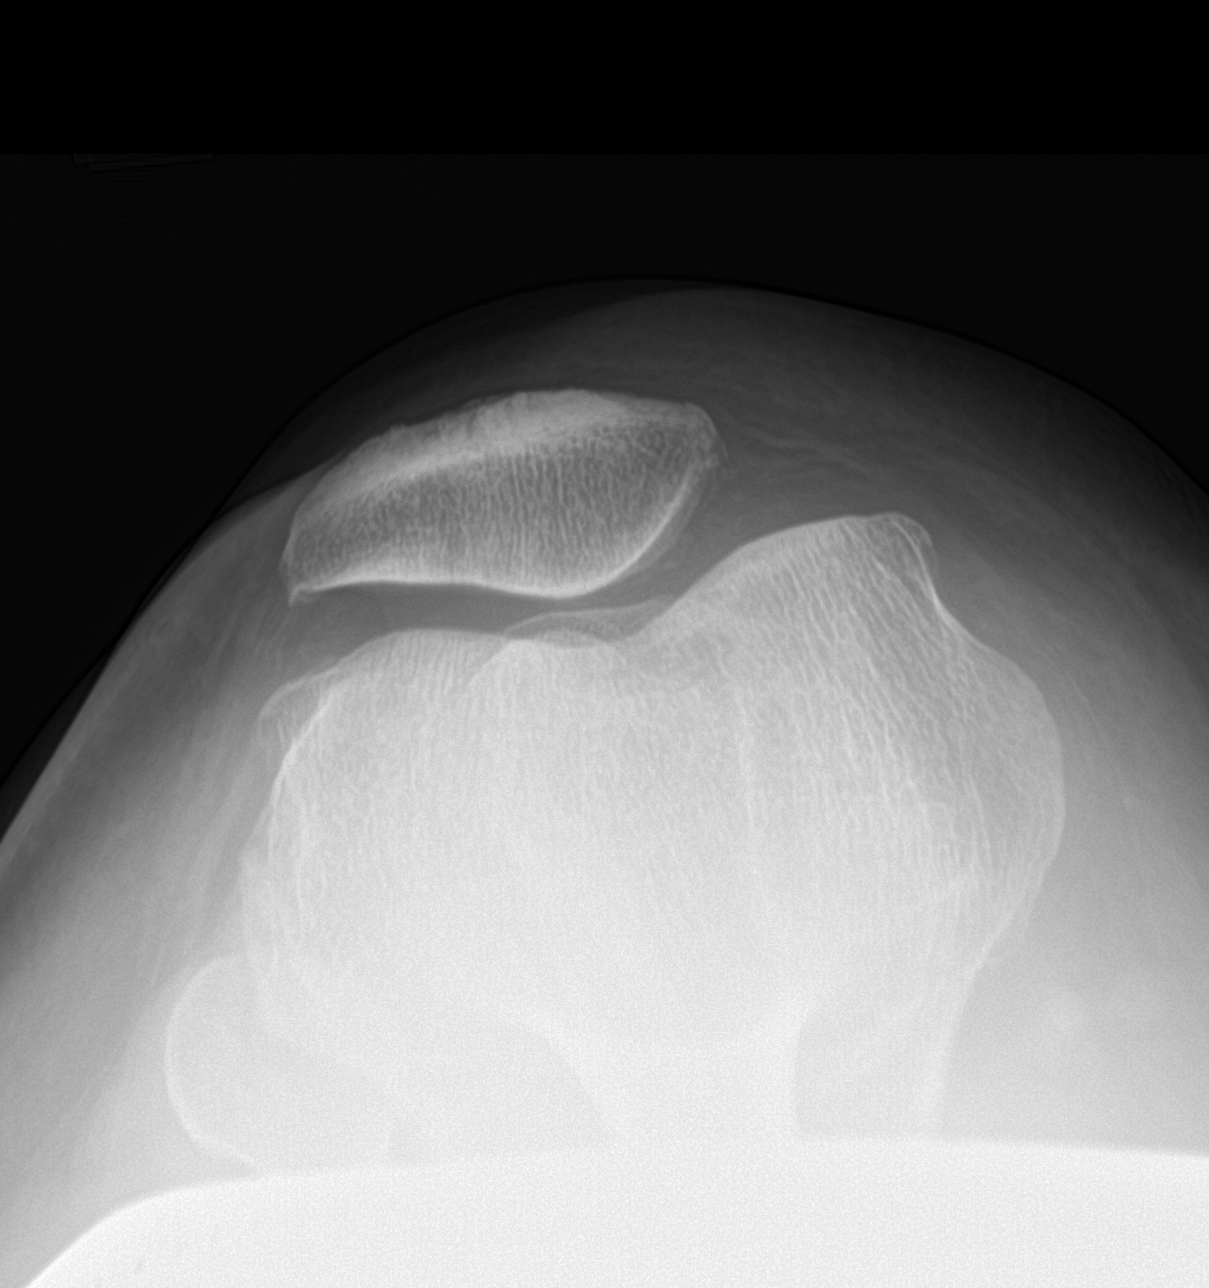

[4 of 4 positions shown; findings below may reference images not displayed]

FINDINGS: No evidence of fracture, dislocation, or joint effusion. Moderate
narrowing of medial joint space is noted. Soft tissues are
unremarkable.
IMPRESSION: Moderate degenerative joint disease.  No acute abnormality is noted.

## 2022-07-07 ENCOUNTER — Encounter: Payer: Self-pay | Admitting: Nurse Practitioner

## 2022-07-07 ENCOUNTER — Ambulatory Visit (INDEPENDENT_AMBULATORY_CARE_PROVIDER_SITE_OTHER): Payer: Self-pay | Admitting: Nurse Practitioner

## 2022-07-07 VITALS — BP 123/80 | HR 61 | Temp 98.1°F | Ht 63.5 in | Wt 238.2 lb

## 2022-07-07 DIAGNOSIS — Z0000 Encounter for general adult medical examination without abnormal findings: Secondary | ICD-10-CM

## 2022-07-07 DIAGNOSIS — Z114 Encounter for screening for human immunodeficiency virus [HIV]: Secondary | ICD-10-CM

## 2022-07-07 DIAGNOSIS — Z1159 Encounter for screening for other viral diseases: Secondary | ICD-10-CM

## 2022-07-07 DIAGNOSIS — Z1231 Encounter for screening mammogram for malignant neoplasm of breast: Secondary | ICD-10-CM

## 2022-07-07 DIAGNOSIS — Z1211 Encounter for screening for malignant neoplasm of colon: Secondary | ICD-10-CM

## 2022-07-07 DIAGNOSIS — R8290 Unspecified abnormal findings in urine: Secondary | ICD-10-CM

## 2022-07-07 DIAGNOSIS — Z136 Encounter for screening for cardiovascular disorders: Secondary | ICD-10-CM

## 2022-07-07 NOTE — Addendum Note (Signed)
Addended by: Jon Billings on: 07/07/2022 02:28 PM   Modules accepted: Orders

## 2022-07-07 NOTE — Progress Notes (Signed)
BP 123/80   Pulse 61   Temp 98.1 F (36.7 C) (Oral)   Ht 5' 3.5" (1.613 m)   Wt 238 lb 3.2 oz (108 kg)   SpO2 97%   BMI 41.53 kg/m    Subjective:    Patient ID: Julia Adams, female    DOB: 05/14/1971, 52 y.o.   MRN: [id number]  HPI: Julia Adams is a 52 y.o. female presenting on 07/07/2022 for comprehensive medical examination. Current medical complaints include:none  She currently lives with: Menopausal Symptoms: no   Denies HA, CP, SOB, dizziness, palpitations, visual changes, and lower extremity swelling.  Depression Screen done today and results listed below:     07/07/2022   10:06 AM 09/11/2020    8:40 AM 09/06/2020    9:48 AM 07/30/2015    3:40 PM  Depression screen PHQ 2/9  Decreased Interest 0 0 0 0  Down, Depressed, Hopeless 0 0 0 0  PHQ - 2 Score 0 0 0 0  Altered sleeping 0  0 0  Tired, decreased energy 0  0 0  Change in appetite 0  0 0  Feeling bad or failure about yourself  0  0 0  Trouble concentrating 0  0 0  Moving slowly or fidgety/restless 0  0 0  Suicidal thoughts 0  0 0  PHQ-9 Score 0  0 0  Difficult doing work/chores Not difficult at all       The patient does not have a history of falls. I did complete a risk assessment for falls. A plan of care for falls was documented.   Past Medical History:  History reviewed. No pertinent past medical history.  Surgical History:  Past Surgical History:  Procedure Laterality Date   ganglion cyst removal Right    index finger   TUBAL LIGATION      Medications:  Current Outpatient Medications on File Prior to Visit  Medication Sig   GLUCOSAMINE-CHONDROITIN-MSM PO Take by mouth.   Multiple Vitamin (MULTI-VITAMIN) tablet Take 1 tablet by mouth daily.   No current facility-administered medications on file prior to visit.    Allergies:  Allergies  Allergen Reactions   Doxycycline Hives    Social History:  Social History   Socioeconomic History   Marital status: Married    Spouse name: Not  on file   Number of children: Not on file   Years of education: Not on file   Highest education level: Not on file  Occupational History   Not on file  Tobacco Use   Smoking status: Never   Smokeless tobacco: Never  Vaping Use   Vaping Use: Never used  Substance and Sexual Activity   Alcohol use: No   Drug use: No   Sexual activity: Yes  Other Topics Concern   Not on file  Social History Narrative   Not on file   Social Determinants of Health   Financial Resource Strain: Not on file  Food Insecurity: Not on file  Transportation Needs: Not on file  Physical Activity: Not on file  Stress: Not on file  Social Connections: Not on file  Intimate Partner Violence: Not on file   Social History   Tobacco Use  Smoking Status Never  Smokeless Tobacco Never   Social History   Substance and Sexual Activity  Alcohol Use No    Family History:  Family History  Problem Relation Age of Onset   Arthritis Mother    Thyroid disease Mother  Heart disease Father    Diabetes Father    Hypertension Father    Hyperlipidemia Father    Stroke Father    Arthritis Sister    Diabetes Sister    Cancer Paternal Grandfather        colon    Past medical history, surgical history, medications, allergies, family history and social history reviewed with patient today and changes made to appropriate areas of the chart.   Review of Systems  Eyes:  Negative for blurred vision and double vision.  Respiratory:  Negative for shortness of breath.   Cardiovascular:  Negative for chest pain, palpitations and leg swelling.  Neurological:  Negative for dizziness and headaches.   All other ROS negative except what is listed above and in the HPI.      Objective:    BP 123/80   Pulse 61   Temp 98.1 F (36.7 C) (Oral)   Ht 5' 3.5" (1.613 m)   Wt 238 lb 3.2 oz (108 kg)   SpO2 97%   BMI 41.53 kg/m   Wt Readings from Last 3 Encounters:  07/07/22 238 lb 3.2 oz (108 kg)  10/19/20 238 lb  (108 kg)  09/11/20 237 lb 3.2 oz (107.6 kg)    Physical Exam Vitals and nursing note reviewed.  Constitutional:      General: She is awake. She is not in acute distress.    Appearance: Normal appearance. She is well-developed. She is not ill-appearing.  HENT:     Head: Normocephalic and atraumatic.     Right Ear: Hearing, tympanic membrane, ear canal and external ear normal. No drainage.     Left Ear: Hearing, tympanic membrane, ear canal and external ear normal. No drainage.     Nose: Nose normal.     Right Sinus: No maxillary sinus tenderness or frontal sinus tenderness.     Left Sinus: No maxillary sinus tenderness or frontal sinus tenderness.     Mouth/Throat:     Mouth: Mucous membranes are moist.     Pharynx: Oropharynx is clear. Uvula midline. No pharyngeal swelling, oropharyngeal exudate or posterior oropharyngeal erythema.  Eyes:     General: Lids are normal.        Right eye: No discharge.        Left eye: No discharge.     Extraocular Movements: Extraocular movements intact.     Conjunctiva/sclera: Conjunctivae normal.     Pupils: Pupils are equal, round, and reactive to light.     Visual Fields: Right eye visual fields normal and left eye visual fields normal.  Neck:     Thyroid: No thyromegaly.     Vascular: No carotid bruit.     Trachea: Trachea normal.  Cardiovascular:     Rate and Rhythm: Normal rate and regular rhythm.     Heart sounds: Normal heart sounds. No murmur heard.    No gallop.  Pulmonary:     Effort: Pulmonary effort is normal. No accessory muscle usage or respiratory distress.     Breath sounds: Normal breath sounds.  Chest:  Breasts:    Right: Normal.     Left: Normal.  Abdominal:     General: Bowel sounds are normal.     Palpations: Abdomen is soft. There is no hepatomegaly or splenomegaly.     Tenderness: There is no abdominal tenderness.  Musculoskeletal:        General: Normal range of motion.     Cervical back: Normal range of motion  and  neck supple.     Right lower leg: No edema.     Left lower leg: No edema.  Lymphadenopathy:     Head:     Right side of head: No submental, submandibular, tonsillar, preauricular or posterior auricular adenopathy.     Left side of head: No submental, submandibular, tonsillar, preauricular or posterior auricular adenopathy.     Cervical: No cervical adenopathy.     Upper Body:     Right upper body: No supraclavicular, axillary or pectoral adenopathy.     Left upper body: No supraclavicular, axillary or pectoral adenopathy.  Skin:    General: Skin is warm and dry.     Capillary Refill: Capillary refill takes less than 2 seconds.     Findings: No rash.  Neurological:     Mental Status: She is alert and oriented to person, place, and time.     Gait: Gait is intact.  Psychiatric:        Attention and Perception: Attention normal.        Mood and Affect: Mood normal.        Speech: Speech normal.        Behavior: Behavior normal. Behavior is cooperative.        Thought Content: Thought content normal.        Judgment: Judgment normal.     Results for orders placed or performed in visit on 10/19/20  Cytology - PAP  Result Value Ref Range   High risk HPV Negative    Adequacy      Satisfactory for evaluation; transformation zone component PRESENT.   Diagnosis      - Negative for intraepithelial lesion or malignancy (NILM)   Comment Normal Reference Range HPV - Negative       Assessment & Plan:   Problem List Items Addressed This Visit   None Visit Diagnoses     Annual physical exam    -  Primary   Health maintenance reviewed during visit today.  Labs ordered.  Reviewed vaccines.  mammogram and Colonoscopy ordered.   Relevant Orders   TSH   CBC with Differential/Platelet   Comprehensive metabolic panel   Urinalysis, Routine w reflex microscopic   HIV Antibody (routine testing w rflx)   Hepatitis C Antibody   Screening for ischemic heart disease       Relevant Orders    Lipid panel   Screening for HIV (human immunodeficiency virus)       Relevant Orders   HIV Antibody (routine testing w rflx)   Encounter for hepatitis C screening test for low risk patient       Relevant Orders   Hepatitis C Antibody   Screening for colon cancer       Relevant Orders   Ambulatory referral to Gastroenterology   Encounter for screening mammogram for malignant neoplasm of breast       Relevant Orders   MM 3D SCREEN BREAST BILATERAL        Follow up plan: Return in about 1 year (around 07/08/2023) for Physical and Fasting labs.   LABORATORY TESTING:  - Pap smear: up to date  IMMUNIZATIONS:   - Tdap: Tetanus vaccination status reviewed: Refused. - Influenza: Refused - Pneumovax: Not applicable - Prevnar: Not applicable - COVID: Not applicable - HPV: Not applicable - Shingrix vaccine:  Discussed at visit today  SCREENING: -Mammogram: Ordered today  - Colonoscopy: Ordered today  - Bone Density: Not applicable  -Hearing Test: Not applicable  -Spirometry: Not  applicable   PATIENT COUNSELING:   Advised to take 1 mg of folate supplement per day if capable of pregnancy.   Sexuality: Discussed sexually transmitted diseases, partner selection, use of condoms, avoidance of unintended pregnancy  and contraceptive alternatives.   Advised to avoid cigarette smoking.  I discussed with the patient that most people either abstain from alcohol or drink within safe limits (<=14/week and <=4 drinks/occasion for males, <=7/weeks and <= 3 drinks/occasion for females) and that the risk for alcohol disorders and other health effects rises proportionally with the number of drinks per week and how often a drinker exceeds daily limits.  Discussed cessation/primary prevention of drug use and availability of treatment for abuse.   Diet: Encouraged to adjust caloric intake to maintain  or achieve ideal body weight, to reduce intake of dietary saturated fat and total fat, to limit  sodium intake by avoiding high sodium foods and not adding table salt, and to maintain adequate dietary potassium and calcium preferably from fresh fruits, vegetables, and low-fat dairy products.    stressed the importance of regular exercise  Injury prevention: Discussed safety belts, safety helmets, smoke detector, smoking near bedding or upholstery.   Dental health: Discussed importance of regular tooth brushing, flossing, and dental visits.    NEXT PREVENTATIVE PHYSICAL DUE IN 1 YEAR. Return in about 1 year (around 07/08/2023) for Physical and Fasting labs.

## 2022-07-08 LAB — COMPREHENSIVE METABOLIC PANEL
ALT: 20 IU/L (ref 0–32)
AST: 18 IU/L (ref 0–40)
Albumin/Globulin Ratio: 1.6 (ref 1.2–2.2)
Albumin: 4.1 g/dL (ref 3.8–4.9)
Alkaline Phosphatase: 110 IU/L (ref 44–121)
BUN/Creatinine Ratio: 14 (ref 9–23)
BUN: 12 mg/dL (ref 6–24)
Bilirubin Total: 0.2 mg/dL (ref 0.0–1.2)
CO2: 20 mmol/L (ref 20–29)
Calcium: 9.4 mg/dL (ref 8.7–10.2)
Chloride: 103 mmol/L (ref 96–106)
Creatinine, Ser: 0.83 mg/dL (ref 0.57–1.00)
Globulin, Total: 2.6 g/dL (ref 1.5–4.5)
Glucose: 92 mg/dL (ref 70–99)
Potassium: 4.5 mmol/L (ref 3.5–5.2)
Sodium: 139 mmol/L (ref 134–144)
Total Protein: 6.7 g/dL (ref 6.0–8.5)
eGFR: 84 mL/min/{1.73_m2} (ref >59)

## 2022-07-08 LAB — LIPID PANEL
Chol/HDL Ratio: 4.3 ratio (ref 0.0–4.4)
Cholesterol, Total: 191 mg/dL (ref 100–199)
HDL: 44 mg/dL (ref >39)
LDL Chol Calc (NIH): 121 mg/dL — ABNORMAL HIGH (ref 0–99)
Triglycerides: 145 mg/dL (ref 0–149)
VLDL Cholesterol Cal: 26 mg/dL (ref 5–40)

## 2022-07-08 LAB — URINALYSIS, ROUTINE W REFLEX MICROSCOPIC
Bilirubin, UA: NEGATIVE
Glucose, UA: NEGATIVE
Ketones, UA: NEGATIVE
Nitrite, UA: NEGATIVE
Protein,UA: NEGATIVE
RBC, UA: NEGATIVE
Specific Gravity, UA: 1.017 (ref 1.005–1.030)
Urobilinogen, Ur: 0.2 mg/dL (ref 0.2–1.0)
pH, UA: 6.5 (ref 5.0–7.5)

## 2022-07-08 LAB — CBC WITH DIFFERENTIAL/PLATELET
Basophils Absolute: 0 10*3/uL (ref 0.0–0.2)
Basos: 0 %
EOS (ABSOLUTE): 0.1 10*3/uL (ref 0.0–0.4)
Eos: 2 %
Hematocrit: 44.2 % (ref 34.0–46.6)
Hemoglobin: 15.1 g/dL (ref 11.1–15.9)
Immature Grans (Abs): 0 10*3/uL (ref 0.0–0.1)
Immature Granulocytes: 0 %
Lymphocytes Absolute: 2.5 10*3/uL (ref 0.7–3.1)
Lymphs: 36 %
MCH: 30.1 pg (ref 26.6–33.0)
MCHC: 34.2 g/dL (ref 31.5–35.7)
MCV: 88 fL (ref 79–97)
Monocytes Absolute: 0.4 10*3/uL (ref 0.1–0.9)
Monocytes: 5 %
Neutrophils Absolute: 4 10*3/uL (ref 1.4–7.0)
Neutrophils: 57 %
Platelets: 296 10*3/uL (ref 150–450)
RBC: 5.02 x10E6/uL (ref 3.77–5.28)
RDW: 13.4 % (ref 11.7–15.4)
WBC: 7.1 10*3/uL (ref 3.4–10.8)

## 2022-07-08 LAB — MICROSCOPIC EXAMINATION: Casts: NONE SEEN /lpf

## 2022-07-08 LAB — TSH: TSH: 1.76 u[IU]/mL (ref 0.450–4.500)

## 2022-07-08 LAB — HEPATITIS C ANTIBODY: Hep C Virus Ab: NONREACTIVE

## 2022-07-08 LAB — HIV ANTIBODY (ROUTINE TESTING W REFLEX): HIV Screen 4th Generation wRfx: NONREACTIVE

## 2022-07-08 NOTE — Progress Notes (Signed)
Hi Julia Adams. It was nice to meet you yesterday.  Your lab work looks good.  Your LDL (bad cholesterol) is elevated.  I recommend a low fat diet and exercise.  Your urine was slightly abnormal so I did sent it out for further testing.  No other concerns at this time. Continue with your current medication regimen.  Follow up as discussed.  Please let me know if you have any questions.

## 2022-07-09 LAB — URINE CULTURE

## 2022-07-09 NOTE — Progress Notes (Signed)
Hi Mickel Baas.  Your urine culture didn't grow any bacteria.  No need for antibiotic treatment.

## 2022-07-14 ENCOUNTER — Encounter: Payer: Self-pay | Admitting: *Deleted

## 2022-07-26 ENCOUNTER — Encounter: Payer: Self-pay | Admitting: Nurse Practitioner

## 2022-07-26 DIAGNOSIS — Z23 Encounter for immunization: Secondary | ICD-10-CM

## 2022-08-01 ENCOUNTER — Ambulatory Visit (INDEPENDENT_AMBULATORY_CARE_PROVIDER_SITE_OTHER): Payer: Self-pay

## 2022-08-01 ENCOUNTER — Other Ambulatory Visit: Payer: Self-pay

## 2022-08-01 DIAGNOSIS — Z0289 Encounter for other administrative examinations: Secondary | ICD-10-CM

## 2022-08-01 DIAGNOSIS — Z23 Encounter for immunization: Secondary | ICD-10-CM

## 2022-08-01 NOTE — Progress Notes (Signed)
Patient presents today for Tdap vaccination, patient received in Left  deltoid, patient tolerated well.

## 2022-08-06 ENCOUNTER — Other Ambulatory Visit: Payer: Self-pay

## 2022-08-07 ENCOUNTER — Ambulatory Visit (INDEPENDENT_AMBULATORY_CARE_PROVIDER_SITE_OTHER): Payer: Self-pay

## 2022-08-07 ENCOUNTER — Other Ambulatory Visit: Payer: Self-pay

## 2022-08-07 DIAGNOSIS — Z0289 Encounter for other administrative examinations: Secondary | ICD-10-CM

## 2022-08-07 DIAGNOSIS — Z23 Encounter for immunization: Secondary | ICD-10-CM

## 2022-08-09 LAB — QUANTIFERON-TB GOLD PLUS
QuantiFERON Mitogen Value: >10 IU/mL
QuantiFERON Nil Value: 0.05 IU/mL
QuantiFERON TB1 Ag Value: 0.05 IU/mL
QuantiFERON TB2 Ag Value: 0.06 IU/mL
QuantiFERON-TB Gold Plus: NEGATIVE

## 2022-08-09 LAB — MEASLES/MUMPS/RUBELLA IMMUNITY
MUMPS ABS, IGG: 150 AU/mL (ref >10.9)
RUBEOLA AB, IGG: >300 AU/mL (ref >16.4)
Rubella Antibodies, IGG: 19.7 index (ref >0.99)

## 2022-08-09 LAB — HEPATITIS B SURFACE ANTIBODY, QUANTITATIVE: Hepatitis B Surf Ab Quant: <3.1 m[IU]/mL — ABNORMAL LOW (ref >9.9)

## 2022-08-11 NOTE — Progress Notes (Signed)
Hi Julia Adams.  Your MMR and quantiferon Gold look good.  I have signed your form and it is ready to pick up.

## 2023-02-17 ENCOUNTER — Other Ambulatory Visit: Payer: Self-pay | Admitting: Nurse Practitioner

## 2023-02-17 DIAGNOSIS — Z1231 Encounter for screening mammogram for malignant neoplasm of breast: Secondary | ICD-10-CM

## 2023-02-23 ENCOUNTER — Encounter: Payer: Self-pay | Admitting: *Deleted

## 2023-03-04 ENCOUNTER — Ambulatory Visit
Admission: RE | Admit: 2023-03-04 | Discharge: 2023-03-04 | Disposition: A | Discharge status: Home / Self Care | Payer: BC Managed Care – PPO | Source: Ambulatory Visit

## 2023-03-04 DIAGNOSIS — Z1231 Encounter for screening mammogram for malignant neoplasm of breast: Secondary | ICD-10-CM

## 2023-03-09 NOTE — Progress Notes (Signed)
Please let patient know her Mammogram did not show any evidence of a malignancy.  The recommendation is to repeat the Mammogram in 1 year.
